# Patient Record
Sex: Female | Born: 1970 | Race: White | Hispanic: No | Marital: Married | Smoking: Former smoker
Health system: Southern US, Community
[De-identification: ages and names within clinical notes are randomized; demographics above are authoritative.]

## PROBLEM LIST (undated history)

## (undated) DIAGNOSIS — L309 Dermatitis, unspecified: Secondary | ICD-10-CM

## (undated) HISTORY — DX: Dermatitis, unspecified: L30.9

---

## 2007-02-20 ENCOUNTER — Encounter: Admission: RE | Admit: 2007-02-20 | Discharge: 2007-02-20 | Payer: Self-pay | Admitting: Family Medicine

## 2008-03-24 ENCOUNTER — Encounter: Admission: RE | Admit: 2008-03-24 | Discharge: 2008-03-24 | Payer: Self-pay | Admitting: Family Medicine

## 2013-10-21 ENCOUNTER — Other Ambulatory Visit: Payer: Self-pay | Admitting: Family Medicine

## 2013-10-21 DIAGNOSIS — Z1231 Encounter for screening mammogram for malignant neoplasm of breast: Secondary | ICD-10-CM

## 2013-11-10 ENCOUNTER — Ambulatory Visit: Payer: Self-pay

## 2015-12-19 ENCOUNTER — Ambulatory Visit (INDEPENDENT_AMBULATORY_CARE_PROVIDER_SITE_OTHER): Payer: BLUE CROSS/BLUE SHIELD | Admitting: Pediatrics

## 2015-12-19 ENCOUNTER — Encounter: Payer: Self-pay | Admitting: Pediatrics

## 2015-12-19 VITALS — BP 104/68 | HR 75 | Temp 98.1°F | Resp 20 | Ht 65.35 in | Wt 135.0 lb

## 2015-12-19 DIAGNOSIS — L2089 Other atopic dermatitis: Secondary | ICD-10-CM

## 2015-12-19 DIAGNOSIS — J301 Allergic rhinitis due to pollen: Secondary | ICD-10-CM | POA: Diagnosis not present

## 2015-12-19 DIAGNOSIS — T781XXA Other adverse food reactions, not elsewhere classified, initial encounter: Secondary | ICD-10-CM | POA: Diagnosis not present

## 2015-12-19 DIAGNOSIS — T7800XA Anaphylactic reaction due to unspecified food, initial encounter: Secondary | ICD-10-CM

## 2015-12-19 MED ORDER — DESONIDE 0.05 % EX OINT: TOPICAL_OINTMENT | CUTANEOUS | 3 refills | Status: AC

## 2015-12-19 MED ORDER — FLUTICASONE PROPIONATE 50 MCG/ACT NA SUSP: 2.0000 | Freq: Every day | NASAL | 5 refills | Status: DC

## 2015-12-19 NOTE — Progress Notes (Signed)
[address] [place] [address] Dept: [phone]  New Patient Note  Patient ID: Sherry Eaton, female    DOB: [birth year]  Age: 45 y.o. MRN: [id number] Date of Office Visit: 12/19/2015 Referring provider: Richmond Campbell, PA-C [address], [place] [address]    Chief Complaint: New Patient (Initial Visit) (pt started with dryness and redness in face on sept 5th. Pt has done a round of antibiotic eye drops and a round of prednisone)  HPI Sherry Eaton presents for evaluation of an itchy rash on her face which began in early August. The rash  improved with the use of prednisone. The rash got worse with the use of Eucrisa .Marland Kitchen She has used desonide cream in the past  On September 5 she had 2 steroid injections and the rash improved for 48 hours. She has had a similar rash during the months of August to October for the past 8 years. She has had seasonal allergic rhinitis for several years . She has aggravation of her symptoms on exposure to dust, cat, dog and  the spring and fall of the year. She also has had eczema.  She has had anaphylactic reactions to peanut, tree nuts, egg, shellfish and mushrooms. She has had itching of her mouth from peaches, apples, plums, cherries and nectarines.. Based on some skin testing done in the past she has been avoiding soy, chicken, garlic, sesame, pork and celery.Marland Kitchen She was  then allergic to all pollens. Pork gives her abdominal pain only.  Review of Systems  Constitutional: Negative.   HENT:       Allergic rhinitis for several years  Eyes:       Itchy eyes at times  Respiratory:       Asthma in childhood but no longer a problem  Cardiovascular: Negative.   Gastrointestinal: Negative.   Genitourinary: Negative.   Musculoskeletal: Negative.   Skin:       History of eczema. Rash on her face from August until October for the past 8 years  Neurological: Negative.   Endo/Heme/Allergies:       No diabetes or thyroid disease. Allergic  reactions to foods  Psychiatric/Behavioral: Negative.     Outpatient Encounter Prescriptions as of 12/19/2015  Medication Sig  . cetirizine (ZYRTEC) 10 MG tablet Take 10 mg by mouth daily.  Marland Kitchen desonide (DESOWEN) 0.05 % ointment Apply twice daily as needed to red itchy areas  . diphenhydrAMINE (BENADRYL) 25 MG tablet Take 25 mg by mouth every 6 (six) hours as needed.  Marland Kitchen EPINEPHrine 0.3 mg/0.3 mL IJ SOAJ injection Inject 0.3 mg into the muscle once.  . [DISCONTINUED] desonide (DESOWEN) 0.05 % ointment Apply 1 application topically 2 (two) times daily.  . fluticasone (FLONASE) 50 MCG/ACT nasal spray Place 2 sprays into both nostrils daily.   No facility-administered encounter medications on file as of 12/19/2015.      Drug Allergies:  Allergies  Allergen Reactions  . Aspirin Other (See Comments)    other  . Iodine   . Penicillin G Other (See Comments)    other  . Shellfish Allergy Other (See Comments)    other    Family History: Primrose's family history includes Allergic rhinitis in her father and mother..Asthma in a nephew. Food allergies in her mother. There is no family history of eczema, hives, chronic bronchitis or emphysema  Social and environmental. There are no pets in the home. She is not exposed to cigarette smoking. She has never smoked cigarettes. She  works indoors. Her symptoms are not worse at work.  Physical Exam: BP 104/68   Pulse 75   Temp 98.1 F (36.7 C) (Oral)   Resp 20   Ht 5' 5.35" (1.66 m)   Wt 135 lb (61.2 kg)   SpO2 100%   BMI 22.22 kg/m    Physical Exam  Constitutional: She is oriented to person, place, and time. She appears well-developed and well-nourished.  HENT:  Eyes normal. Ears normal. Nose moderate swelling of nasal turbinates. Pharynx normal.  Neck: Neck supple. No thyromegaly present.  Cardiovascular:  S1 and S2 normal no murmurs  Pulmonary/Chest:  Clear to percussion and auscultation  Abdominal: Soft. There is no tenderness (no  hepatosplenomegaly).  Lymphadenopathy:    She has no cervical adenopathy.  Neurological: She is alert and oriented to person, place, and time.  Skin:  Erythematous papulosquamous eruption involving her face. Mainly forehead and upper cheeks. There was periorbital involvement also. She had eczematoid changes on her thorax and the back of her legs  Psychiatric: She has a normal mood and affect. Her behavior is normal. Judgment and thought content normal.  Vitals reviewed.   Diagnostics:  Allergy skin tests were extremely positive to grass pollens, weeds, tree pollens, dust mite, cat, dog, cockroach. Slight reactivity noted to molds. She had very positive skin test to peanut, egg, shellfish, cashew, shrimp, crab, walnut. There was mild reactivity to pork. Skin testing to soy was insignificant, skin testing to celery was negative, skin testing to sesame , garlic and chicken were negative  Assessment  Assessment and Plan: 1. Anaphylactic shock due to food, initial encounter   2. Acute seasonal allergic rhinitis due to pollen   3. Flexural atopic dermatitis   4. Pollen-food allergy, initial encounter     Meds ordered this encounter  Medications  . fluticasone (FLONASE) 50 MCG/ACT nasal spray    Sig: Place 2 sprays into both nostrils daily.    Dispense:  16 g    Refill:  5  . desonide (DESOWEN) 0.05 % ointment    Sig: Apply twice daily as needed to red itchy areas    Dispense:  30 g    Refill:  3    Patient Instructions  Environmental control of dust mite Zyrtec 10 mg once a day for runny nose or itchy eyes or itching Fluticasone 2 sprays per nostril once a day for stuffy nose Opcon-A one drop 3 times a day if needed for itchy eyes Add prednisone 20 mg twice a day for 3 days, 20 mg on the fourth day, 10 mg on the fifth day Desonide 0.05% ointment twice a day to red itchy areas. We will keep in mind the possibility of a photosensitive dermatitis  Avoid peanuts, tree nuts, egg,  mushrooms, shellfish. If you have an allergic reaction take Benadryl 4 teaspoonfuls every 4 hours. If you have life-threatening symptoms inject with EpiPen 0.3 mg. Be careful with pork.  I gave you a list of foods associated with the oral allergy syndrome. You  have had difficulties from peach, apple, plums, cherries, nectarines. If you have itching of your mouth see which foods you ate  before the itching began  You may add soy and chicken  into the diet .    Return in about 4 weeks (around 01/16/2016).   Thank you for the opportunity to care for this patient.  Please do not hesitate to contact me with questions.  Tonette BihariJ. A. Tarvaris Puglia, M.D.  [facility] [address] [place], [place] [address] [phone]

## 2015-12-19 NOTE — Patient Instructions (Addendum)
Environmental control of dust mite Zyrtec 10 mg once a day for runny nose or itchy eyes or itching Fluticasone 2 sprays per nostril once a day for stuffy nose Opcon-A one drop 3 times a day if needed for itchy eyes Add prednisone 20 mg twice a day for 3 days, 20 mg on the fourth day, 10 mg on the fifth day Desonide 0.05% ointment twice a day to red itchy areas. We will keep in mind the possibility of a photosensitive dermatitis  Avoid peanuts, tree nuts, egg, mushrooms, shellfish. If you have an allergic reaction take Benadryl 4 teaspoonfuls every 4 hours. If you have life-threatening symptoms inject with EpiPen 0.3 mg. Be careful with pork.  I gave you a list of foods associated with the oral allergy syndrome. You  have had difficulties from peach, apple, plums, cherries, nectarines. If you have itching of your mouth see which foods you ate  before the itching began  You may add soy and chicken  into the diet .

## 2015-12-20 DIAGNOSIS — L2089 Other atopic dermatitis: Secondary | ICD-10-CM | POA: Insufficient documentation

## 2015-12-20 DIAGNOSIS — T781XXA Other adverse food reactions, not elsewhere classified, initial encounter: Secondary | ICD-10-CM | POA: Insufficient documentation

## 2015-12-20 DIAGNOSIS — T7800XA Anaphylactic reaction due to unspecified food, initial encounter: Secondary | ICD-10-CM | POA: Insufficient documentation

## 2015-12-20 DIAGNOSIS — J301 Allergic rhinitis due to pollen: Secondary | ICD-10-CM | POA: Insufficient documentation

## 2015-12-26 ENCOUNTER — Telehealth: Payer: Self-pay | Admitting: Allergy

## 2015-12-26 NOTE — Telephone Encounter (Signed)
Please advise 

## 2015-12-26 NOTE — Telephone Encounter (Signed)
Dr. Nunzio CobbsBobbitt, am sorry I thought it was your patient. Will give to Dr. Beaulah DinningBardelas.

## 2015-12-26 NOTE — Telephone Encounter (Signed)
No problem.

## 2015-12-26 NOTE — Telephone Encounter (Signed)
Patient called said she was seen on 12-19-15. Said eyes were getting red, itchy and swollen. Said she didn't think the eye drops were helping. She is  Still using the opcon-A eye drops, fluticasone,and desonide. She said she would be interested in starting allergy injections if you thought it would help.Please Advise

## 2015-12-26 NOTE — Telephone Encounter (Signed)
It appears from her testing that she would be a good candidate for immunotherapy, but she saw Dr. Beaulah DinningBardelas on 12/19/15 and I have never seen her. Please check with him about starting injections and he would place the orders.

## 2015-12-26 NOTE — Telephone Encounter (Signed)
Please call patient. Tell her that I would like to see how her face is doing in 3 weeks before making a decision regarding allergy injections

## 2015-12-26 NOTE — Telephone Encounter (Signed)
Informed patient to follow up with Dr. Beaulah DinningBardelas in three weeks. If it gets really bad call office sooner.

## 2016-01-01 ENCOUNTER — Ambulatory Visit: Payer: Self-pay | Admitting: Allergy and Immunology

## 2016-01-23 ENCOUNTER — Encounter: Payer: Self-pay | Admitting: Pediatrics

## 2016-01-23 ENCOUNTER — Ambulatory Visit (INDEPENDENT_AMBULATORY_CARE_PROVIDER_SITE_OTHER): Payer: BLUE CROSS/BLUE SHIELD | Admitting: Pediatrics

## 2016-01-23 VITALS — BP 90/70 | HR 89 | Temp 98.1°F | Resp 16

## 2016-01-23 DIAGNOSIS — J301 Allergic rhinitis due to pollen: Secondary | ICD-10-CM | POA: Diagnosis not present

## 2016-01-23 DIAGNOSIS — L2089 Other atopic dermatitis: Secondary | ICD-10-CM | POA: Diagnosis not present

## 2016-01-23 DIAGNOSIS — T7800XD Anaphylactic reaction due to unspecified food, subsequent encounter: Secondary | ICD-10-CM

## 2016-01-23 DIAGNOSIS — T781XXD Other adverse food reactions, not elsewhere classified, subsequent encounter: Secondary | ICD-10-CM

## 2016-01-23 NOTE — Progress Notes (Signed)
  [address] [place] [address] Dept: [phone]  FOLLOW UP NOTE  Patient ID: Sherry Eaton, female    DOB: 08-20-1970  Age: 45 y.o. MRN: [id number] Date of Office Visit: 01/23/2016  Assessment  Chief Complaint: Allergic Rhinitis  (eczema break out on arm)  HPI Sherry Flemingsarah Ollinger presents for follow-up of severe eczema, allergic rhinitis and food allergies. With the use of prednisone for 5 days , her eczema I did improve significantly , but then 2 days later it came back. Her nasal symptoms are well controlled. She is using desonide 0.05% ointment twice a day to red itchy areas She continues to avoid peanuts, tree nuts, egg, mushroom, shellfish. She has oral allergy syndrome and  avoids fresh peaches, apples, plums, cherries and nectarines.   Current medications are Zyrtec 10 mg once a Fluticasone 2 sprays per nostril once a Month desonide 0.05% ointment twice a day to red itchy areas and Benadryl and EpiPen 0.3 mg if needed.   Drug Allergies:  Allergies  Allergen Reactions  . Aspirin Other (See Comments)    other  . Iodine   . Penicillin G Other (See Comments)    other  . Shellfish Allergy Other (See Comments)    other  . Eucrisa [Crisaborole] Rash    Physical Exam: BP 90/70   Pulse 89   Temp 98.1 F (36.7 C) (Oral)   Resp 16    Physical Exam  Constitutional: She is oriented to person, place, and time. She appears well-developed.  HENT:  Eyes normal. Ears normal. Nose mild swelling of his turbinates. Pharynx normal.  Cardiovascular:  S1 and S2 normal no murmurs  Pulmonary/Chest:  Clear to percussion and auscultation  Lymphadenopathy:    She has no cervical adenopathy.  Neurological: She is alert and oriented to person, place, and time.  Skin:  Moderate to severe eczematoid changes on her face, back of her neck  and arms  Psychiatric: She has a normal mood and affect. Her behavior is normal. Thought content normal.  Vitals reviewed.   Diagnostics:   none  Assessment and Plan: 1. Flexural atopic dermatitis   2. Anaphylactic shock due to food, subsequent encounter   3. Pollen-food allergy, subsequent encounter   4. Acute seasonal allergic rhinitis due to pollen     No orders of the defined types were placed in this encounter.   Patient Instructions  Continue on the current treatment plan She  will try Atopiclair ointment twice a day to see if she has further relief from her eczema. She may use it with desonide, but she will try it on an  area initially away from the face We will go ahead and initiate the approval process for Dupixent for her severe eczema   No Follow-up on file.    Thank you for the opportunity to care for this patient.  Please do not hesitate to contact me with questions.  Tonette BihariJ. A. Kano Heckmann, M.D.  [facility] [address] [place], [place] [address] [phone]

## 2016-01-24 ENCOUNTER — Telehealth: Payer: Self-pay

## 2016-01-24 ENCOUNTER — Other Ambulatory Visit: Payer: Self-pay

## 2016-01-24 MED ORDER — ATOPICLAIR EX CREA: TOPICAL_CREAM | CUTANEOUS | 0 refills | Status: AC

## 2016-01-24 NOTE — Telephone Encounter (Signed)
Per Dr. Beaulah DinningBardelas, call patient.  He wants to speak with patient regarding Dupixent.  Dr. Beaulah DinningBardelas spoke with patient.  Mailed information on Dupixent to patient.

## 2016-01-24 NOTE — Patient Instructions (Addendum)
Continue on the current treatment plan She  will try Atopiclair ointment twice a day to see if she has further relief from her eczema. She may use it with desonide, but she will try it on an  area initially away from the face We will go ahead and initiate the approval process for Dupixent for her severe eczema

## 2016-01-30 NOTE — Telephone Encounter (Signed)
On 01/24/16 called in Atopiclair cream to [facility] [address].  Needs PA.  PA completed.  Received notification from [employer] that Atopiclair is not a covered drug and there is no PA available.  Per Dr. Beaulah DinningBardelas, continue Desonide to eczema areas as directed.  01/30/16 Called patient and gave all information.

## 2016-03-05 ENCOUNTER — Telehealth: Payer: Self-pay

## 2016-03-05 NOTE — Telephone Encounter (Signed)
Patient spoke with Dr. Beaulah DinningBardelas on 01/24/16 about starting Dupixent.  Patient does want to start this.  She thought Dr. Beaulah DinningBardelas had already spoken with Tammy about starting process for Dupixent. Please call patient with status.

## 2016-03-05 NOTE — Telephone Encounter (Signed)
Called patient and advised to contact [facility] to set up delivery for Dupixent had already spoken to patient on 12/29 to advise rx ready and contact pharmacy after getting copay card info

## 2016-03-08 ENCOUNTER — Ambulatory Visit (INDEPENDENT_AMBULATORY_CARE_PROVIDER_SITE_OTHER): Payer: 59 | Admitting: *Deleted

## 2016-03-08 DIAGNOSIS — L209 Atopic dermatitis, unspecified: Secondary | ICD-10-CM

## 2016-03-08 NOTE — Progress Notes (Signed)
Immunotherapy   Patient Details  Name: Sherry Eaton Wirz MRN: [id number] Date of Birth: 07-Sep-1970  03/08/2016  Sherry Eaton Motz here to start Dupixent. Rosana Fretarrie Whitaker, CMA administered 300 mg in right outer thigh. Patient administered 300 mg in left outer thigh.  Frequency:Q 14 days  Epi-Pen:Epi-Pen Available  Consent signed and patient instructions given. Patient waited 15 minutes with no problems.  Lot number: [id number]   Expires: 04/2017 NDC: [id number]   Maurine SimmeringLogan D Keawe Marcello 03/08/2016, 11:57 AM

## 2016-05-08 ENCOUNTER — Other Ambulatory Visit: Payer: Self-pay | Admitting: Allergy

## 2016-05-10 ENCOUNTER — Telehealth: Payer: Self-pay | Admitting: *Deleted

## 2016-05-10 NOTE — Telephone Encounter (Signed)
Patient new insurance denied her Dupixent so I have sent note to Dr Beaulah DinningBardelas regarding next step. I have also talked to patient and advised same. If you get call again before I get a chance to advise them you can relay info

## 2016-05-10 NOTE — Telephone Encounter (Signed)
Called patient advised her that her new insurance [employer] [place] denied her Dupixent that she is currently on due to no trial of Protopic.  I had to send all her records with PA request.  I advised her I would ocnsult with Dr Beaulah DinningBardelas to see if he wants to discuss with physician reviewer, appeal or meet the criteria of using Protopic as their policy mandates. He probably will not get this message until next week since he is not working today and I advised patient of same.

## 2016-05-10 NOTE — Telephone Encounter (Signed)
Pharmacy calling regarding dupixent, it needs a PA and I was told the insurance needed to be called to get approved. She said she had also called on the 7th.

## 2016-05-13 NOTE — Telephone Encounter (Signed)
Please contact Tammy. What strength Protopic does  she need  to use, and for how long? How has she done on Dupixent?

## 2016-05-14 NOTE — Telephone Encounter (Signed)
Lm for pt to call us back  

## 2016-05-14 NOTE — Telephone Encounter (Signed)
Insurance called and said Dupixent was not covered. Waiting for patient to call back.

## 2016-05-15 MED ORDER — TACROLIMUS 0.1 % EX OINT: TOPICAL_OINTMENT | Freq: Two times a day (BID) | CUTANEOUS | 2 refills | Status: AC

## 2016-05-15 NOTE — Telephone Encounter (Signed)
Received callback from patient regarding denial for Dupixent.  Per Dr Beaulah DinningBardelas  In last week contact will have patient try Protopic .1% as required by insurance and made appt for patient to followup with Dr B in 5 weeks so we have proof of trial and therapeutic failure for ins.  She advised she did really well on Dupixent and had very few areas mainly around eyes that flared up on drug

## 2016-05-15 NOTE — Addendum Note (Signed)
Addended by: Devoria GlassingVONCANNON, Celsa Nordahl M on: 05/15/2016 02:01 PM   Modules accepted: Orders

## 2016-05-15 NOTE — Telephone Encounter (Signed)
Elidel  1% cream was prescribed . She will use twice a day. I will see her in about 4 weeks

## 2016-05-16 MED ORDER — PIMECROLIMUS 1 % EX CREA: TOPICAL_CREAM | CUTANEOUS | 5 refills | Status: DC

## 2016-05-16 NOTE — Addendum Note (Signed)
Addended byClyda Greener: Amaree Leeper M on: 05/16/2016 01:59 PM   Modules accepted: Orders

## 2016-05-16 NOTE — Telephone Encounter (Signed)
Called in Elidel 1% cream per Dr. Beaulah DinningBardelas.

## 2016-06-17 ENCOUNTER — Telehealth: Payer: Self-pay | Admitting: *Deleted

## 2016-06-17 NOTE — Telephone Encounter (Signed)
I contacted patient regarding possible free drug Dupixent through bridge program while she is having to use  Elidel as required by her new insurance.  She advised that right now the Elidel is working well and will hold off on Dupixent.  I advised her if she doesn't continue to do well and wants to go back on therapy to let me know.

## 2016-06-17 NOTE — Telephone Encounter (Signed)
Thanks. Sent to [facility]

## 2016-06-24 ENCOUNTER — Encounter: Payer: Self-pay | Admitting: Pediatrics

## 2016-06-24 ENCOUNTER — Ambulatory Visit (INDEPENDENT_AMBULATORY_CARE_PROVIDER_SITE_OTHER): Payer: BLUE CROSS/BLUE SHIELD | Admitting: Pediatrics

## 2016-06-24 VITALS — BP 96/58 | HR 72 | Temp 97.7°F | Resp 16

## 2016-06-24 DIAGNOSIS — J301 Allergic rhinitis due to pollen: Secondary | ICD-10-CM

## 2016-06-24 DIAGNOSIS — T781XXD Other adverse food reactions, not elsewhere classified, subsequent encounter: Secondary | ICD-10-CM

## 2016-06-24 DIAGNOSIS — L2089 Other atopic dermatitis: Secondary | ICD-10-CM

## 2016-06-24 NOTE — Patient Instructions (Addendum)
Continue on her current treatment plan. Tacrolimus 0.1% ointment twice a day if needed to red itchy areas Avoid peanuts, tree nuts, egg, mushroom, shellfish. If she has an allergic reaction she can take Benadryl 50 mg every 4 hours and if she has life-threatening symptoms she will inject with EpiPen 0.3 mg Call me if she is not doing well on this treatment plan

## 2016-06-24 NOTE — Progress Notes (Signed)
  [address] [place] [address] Dept: [phone]  FOLLOW UP NOTE  Patient ID: Sherry Eaton, female    DOB: 1970/10/16  Age: 46 y.o. MRN: [id number] Date of Office Visit: 06/24/2016  Assessment  Chief Complaint: Eczema  HPI Sherry Eaton presents for follow-up of eczema. She had 3 months of dupilumab and her eczema did improve. She changed insurance companies and they would not pay for. She has to fail tacrolimus 0.1% ointment twice a day for approval. Her eczema has improved with tacrolimus and she uses it once a week. At times she has been having nasal congestion. She has a history of oral allergy syndrome  Current medications are Benadryl 25 mg at night, fluticasone 2 sprays per nostril once a day if needed and Benadryl and EpiPen 0.3 mg if needed and tacrolimus 0.1% ointment twice a day if needed   Drug Allergies:  Allergies  Allergen Reactions  . Aspirin Other (See Comments)    other  . Iodine   . Penicillin G Other (See Comments)    other  . Shellfish Allergy Other (See Comments)    other  . Eucrisa [Crisaborole] Rash    Physical Exam: BP (!) 96/58   Pulse 72   Temp 97.7 F (36.5 C) (Oral)   Resp 16   SpO2 100%    Physical Exam  Constitutional: She is oriented to person, place, and time. She appears well-developed and well-nourished.  HENT:  Eyes normal. Ears normal. Nose mild swelling of nasal turbinates. Pharynx normal.  Neck: Neck supple.  Cardiovascular:  S1 and S2 normal no murmurs  Pulmonary/Chest:  Clear to percussion and auscultation  Lymphadenopathy:    She has no cervical adenopathy.  Neurological: She is alert and oriented to person, place, and time.  Skin:  Clear  Psychiatric: She has a normal mood and affect. Her behavior is normal. Judgment and thought content normal.  Vitals reviewed.   Diagnostics:   none Assessment and Plan: 1. Flexural atopic dermatitis   2. Seasonal allergic rhinitis due to pollen   3. Pollen-food  allergy, subsequent encounter       Patient Instructions  Continue on her current treatment plan. Tacrolimus 0.1% ointment twice a day if needed to red itchy areas Avoid peanuts, tree nuts, egg, mushroom, shellfish. If she has an allergic reaction she can take Benadryl 50 mg every 4 hours and if she has life-threatening symptoms she will inject with EpiPen 0.3 mg Call me if she is not doing well on this treatment plan   Return in about 6 months (around 12/24/2016).    Thank you for the opportunity to care for this patient.  Please do not hesitate to contact me with questions.  Tonette Bihari, M.D.  [facility] [address] [place], [place] [address] [phone]

## 2016-11-21 ENCOUNTER — Other Ambulatory Visit: Payer: Self-pay | Admitting: Family Medicine

## 2016-11-21 DIAGNOSIS — Z1231 Encounter for screening mammogram for malignant neoplasm of breast: Secondary | ICD-10-CM

## 2016-11-27 ENCOUNTER — Ambulatory Visit
Admission: RE | Admit: 2016-11-27 | Discharge: 2016-11-27 | Disposition: A | Discharge status: Home / Self Care | Payer: BLUE CROSS/BLUE SHIELD | Source: Ambulatory Visit | Attending: Family Medicine | Admitting: Family Medicine

## 2016-11-27 DIAGNOSIS — Z1231 Encounter for screening mammogram for malignant neoplasm of breast: Secondary | ICD-10-CM

## 2016-12-31 ENCOUNTER — Other Ambulatory Visit: Payer: Self-pay | Admitting: Allergy

## 2017-03-11 ENCOUNTER — Encounter: Payer: Self-pay | Admitting: Pediatrics

## 2017-03-11 ENCOUNTER — Ambulatory Visit (INDEPENDENT_AMBULATORY_CARE_PROVIDER_SITE_OTHER): Payer: BLUE CROSS/BLUE SHIELD | Admitting: Pediatrics

## 2017-03-11 VITALS — BP 106/74 | HR 84 | Temp 98.1°F | Resp 16 | Ht 65.5 in | Wt 133.8 lb

## 2017-03-11 DIAGNOSIS — T7800XD Anaphylactic reaction due to unspecified food, subsequent encounter: Secondary | ICD-10-CM | POA: Diagnosis not present

## 2017-03-11 DIAGNOSIS — J301 Allergic rhinitis due to pollen: Secondary | ICD-10-CM

## 2017-03-11 DIAGNOSIS — L2089 Other atopic dermatitis: Secondary | ICD-10-CM

## 2017-03-11 DIAGNOSIS — T781XXD Other adverse food reactions, not elsewhere classified, subsequent encounter: Secondary | ICD-10-CM | POA: Diagnosis not present

## 2017-03-11 MED ORDER — FLUTICASONE PROPIONATE 50 MCG/ACT NA SUSP: NASAL | 5 refills | Status: AC

## 2017-03-11 MED ORDER — TRIAMCINOLONE ACETONIDE 0.1 % EX OINT: TOPICAL_OINTMENT | CUTANEOUS | 3 refills | Status: AC

## 2017-03-11 MED ORDER — EPINEPHRINE 0.3 MG/0.3ML IJ SOAJ: INTRAMUSCULAR | 1 refills | Status: AC

## 2017-03-11 NOTE — Progress Notes (Signed)
[address] [place] [address] Dept: [phone]  FOLLOW UP NOTE  Patient ID: Sherry Eaton, female    DOB: 08-29-1970  Age: 47 y.o. MRN: [id number] Date of Office Visit: 03/11/2017  Assessment  Chief Complaint: Eczema  HPI Sherry Eaton presents for follow-up of eczema and food allergies. She did well on Dupixent , but could not afford it when  she did not have health insurance. She had an itchy rash from September until December. She feels that it was related to venlafaxine gel which contains pork. She is allergic to pork. Over the past few weeks her eczema has worsened without a clear-cut reason. She is using a new shampoo without allergens. She is not using Benadryl , fluticasone or tacrolimus 0.1% ointment  Current medications are outlined in the chart   Drug Allergies:  Allergies  Allergen Reactions  . Other Anaphylaxis    Mushrooms, cherries, plum, apple, peach, nectarines, pork  . Aspirin Other (See Comments)    other  . Eggs Or Egg-Derived Products   . Iodine   . Peanut-Containing Drug Products     All tree nuts  . Penicillin G Other (See Comments)    other  . Shellfish Allergy Other (See Comments)    other  . Eucrisa [Crisaborole] Rash  . Venlafaxine Rash    Physical Exam: BP 106/74   Pulse 84   Temp 98.1 F (36.7 C) (Oral)   Resp 16   Ht 5' 5.5" (1.664 m)   Wt 133 lb 12.8 oz (60.7 kg)   SpO2 97%   BMI 21.93 kg/m    Physical Exam  Constitutional: She is oriented to person, place, and time. She appears well-developed and well-nourished.  HENT:  Eyes normal. Ears normal. Nose mild swelling of nasal  turbinates. Pharynx normal.  Neck: Neck supple.  Cardiovascular:  S1 and S2 normal no murmurs  Pulmonary/Chest:  Clear to percussion and auscultation  Lymphadenopathy:    She has no cervical adenopathy.  Neurological: She is alert and oriented to person, place, and time.  Skin:  Generalized eczema  Psychiatric: She has a normal mood and  affect. Her behavior is normal. Judgment and thought content normal.  Vitals reviewed.   Diagnostics:    Assessment and Plan: 1. Anaphylactic shock due to food, subsequent encounter   2. Flexural atopic dermatitis   3. Pollen-food allergy, subsequent encounter   4. Seasonal allergic rhinitis due to pollen     Meds ordered this encounter  Medications  . triamcinolone ointment (KENALOG) 0.1 %    Sig: Apply twice a day if needed to red itchy areas below the face.    Dispense:  45 g    Refill:  3  . fluticasone (FLONASE) 50 MCG/ACT nasal spray    Sig: 2 sprays per nostril once a day if needed for stuffy nose.    Dispense:  16 g    Refill:  5    Please keep rx on file. Pt will call when needed.  Marland Kitchen. EPINEPHrine 0.3 mg/0.3 mL IJ SOAJ injection    Sig: Use as directed for severe allergic reaction.    Dispense:  2 Device    Refill:  1    Dispense mylan generic brand only    Patient Instructions  Benadryl 25 mg-take 1 tablet at night for itching Zyrtec 10 mg-take 1 tablet in the morning if needed for itching Tacrolimus 0.1% ointment twice a day if needed to red itchy areas on the face Triamcinolone 0.1%  ointment twice a day if needed to red itchy areas below the face Fluticasone 2 sprays per nostril once a day if needed for stuffy nose Avoid peanuts, tree nuts, egg, mushroom, shellfish, pork. If you have an allergic reaction take Benadryl 50 mg every 4 hours and if you have life-threatening symptoms inject with EpiPen 0.3 mg Stop using the new shampoo to see if it makes a difference  Call me if you're not doing well on this treatment plan   Return in about 4 weeks (around 04/08/2017).    Thank you for the opportunity to care for this patient.  Please do not hesitate to contact me with questions.  Tonette Bihari, M.D.  [facility] [address] [place], [place] [address] [phone]

## 2017-03-11 NOTE — Patient Instructions (Addendum)
Benadryl 25 mg-take 1 tablet at night for itching Zyrtec 10 mg-take 1 tablet in the morning if needed for itching Tacrolimus 0.1% ointment twice a day if needed to red itchy areas on the face Triamcinolone 0.1% ointment twice a day if needed to red itchy areas below the face Fluticasone 2 sprays per nostril once a day if needed for stuffy nose Avoid peanuts, tree nuts, egg, mushroom, shellfish, pork. If you have an allergic reaction take Benadryl 50 mg every 4 hours and if you have life-threatening symptoms inject with EpiPen 0.3 mg Stop using the new shampoo to see if it makes a difference  Call me if you're not doing well on this treatment plan

## 2017-03-25 ENCOUNTER — Telehealth: Payer: Self-pay | Admitting: Allergy

## 2017-03-25 NOTE — Telephone Encounter (Signed)
Patient called and said she was still having trouble. Said she would like to be tested when she comes back in just want to know what is going on.Call [phone]

## 2017-03-31 NOTE — Telephone Encounter (Signed)
I talked to the patient. She has new insurance. She will call Tammy to reapply for Dupixent . Will consider updating her testing to foods when  she returns . I discussed with the patient. No need to call her.

## 2017-05-10 DIAGNOSIS — B029 Zoster without complications: Secondary | ICD-10-CM | POA: Insufficient documentation

## 2017-06-25 ENCOUNTER — Telehealth: Payer: Self-pay | Admitting: *Deleted

## 2017-06-25 MED ORDER — PREDNISONE 10 MG (21) PO TBPK: ORAL_TABLET | ORAL | 0 refills | Status: DC

## 2017-06-25 NOTE — Telephone Encounter (Signed)
Pt started dupixent last week 06/17/17 and still itching and burning and has rash from hands up to shoulder, neck down to breasts and breast down to bikini area. Pt last seen in January by Dr. Beaulah DinningBardelas.  In March she had shingles and took prednisone.  She states rash is much worse and "blown up" since she saw Dr. Beaulah DinningBardelas in January. She states she is going to try acupuncture next week. [facility] and [facility] informed her it could take anywhere up to 12 weeks for Dupixent to kick in to see improvement. She would like to see if Dr. Beaulah DinningBardelas has any recommendations to help. She is using prescription hydrocortisone cream everywhere on body expect on her chest because she states the skin on her chest is thin and she is also taking benadryl.   Should she come in for office visit? She was suppose to return in 4 weeks after her 03/11/17 OV.   Please advise.

## 2017-06-25 NOTE — Telephone Encounter (Signed)
We could repeat prednisone for 5 days to try to get her through this episode . Some patients with eczema start responding to Dupixent withi 2-4 weeks

## 2017-06-25 NOTE — Telephone Encounter (Signed)
Patient informed. I will send in prednisone pack.

## 2017-10-14 ENCOUNTER — Ambulatory Visit: Payer: BLUE CROSS/BLUE SHIELD | Admitting: Pediatrics

## 2017-10-14 ENCOUNTER — Encounter: Payer: Self-pay | Admitting: Pediatrics

## 2017-10-14 VITALS — BP 98/60 | HR 84 | Temp 98.2°F | Resp 16 | Ht 65.5 in | Wt 129.0 lb

## 2017-10-14 DIAGNOSIS — T781XXD Other adverse food reactions, not elsewhere classified, subsequent encounter: Secondary | ICD-10-CM

## 2017-10-14 DIAGNOSIS — L2089 Other atopic dermatitis: Secondary | ICD-10-CM | POA: Diagnosis not present

## 2017-10-14 DIAGNOSIS — T7800XD Anaphylactic reaction due to unspecified food, subsequent encounter: Secondary | ICD-10-CM

## 2017-10-14 DIAGNOSIS — J301 Allergic rhinitis due to pollen: Secondary | ICD-10-CM | POA: Diagnosis not present

## 2017-10-14 NOTE — Progress Notes (Signed)
  [address] [place] [id number] Dept: [phone]  FOLLOW UP NOTE  Patient ID: Sherry Eaton, female    DOB: 07/29/1970  Age: 47 y.o. MRN: [id number] Date of Office Visit: 10/14/2017  Assessment  Chief Complaint: Eczema (dupixent injections. 2 inj every 4 weeks. Eczema improved.  Had food testing.  Eliminated dailry.)  HPI Sherry Eaton presents for follow-up of eczema and food allergies.  She resumed her Dupixent injections in March and feels that she has improved.  She continues to avoid eggs, peanuts, tree nuts, shellfish mushrooms, pork, garlic, chicken.  She had some IgG testing to foods and is now avoiding milk products .  She is thinks that she is better off milk however she is now on Dupixent .  She does not want to use triamcinolone or Elidel so she can see how much improvement there is on Dupixent .  She is on cetirizine 10 mg once a day   Drug Allergies:  Allergies  Allergen Reactions  . Other Anaphylaxis    Mushrooms, cherries, plum, apple, peach, nectarines, pork  . Aspirin Other (See Comments)    other  . Aspirin-Acetaminophen-Caffeine     other  . Eggs Or Egg-Derived Products   . Influenza Vaccines     Other  . Iodine   . Peanut-Containing Drug Products     All tree nuts  . Penicillin G Other (See Comments)    other other  . Shellfish Allergy Other (See Comments)    other other  . Eucrisa [Crisaborole] Rash  . Venlafaxine Rash    Physical Exam: BP 98/60 (BP Location: Left Arm, Patient Position: Sitting, Cuff Size: Normal)   Pulse 84   Temp 98.2 F (36.8 C) (Temporal)   Resp 16   Ht 5' 5.5" (1.664 m)   Wt 129 lb (58.5 kg)   SpO2 98%   BMI 21.14 kg/m    Physical Exam  Constitutional: She appears well-developed and well-nourished.  HENT:  Eyes normal.  Ears normal.  Nose normal.  Pharynx normal.  Neck: Neck supple.  Cardiovascular:  S1-S2 normal no murmurs  Pulmonary/Chest:  Clear to percussion and auscultation  Lymphadenopathy:    She has no cervical adenopathy.  Skin:  Eczematoid changes around her upper eyelids and in the right wrist  Vitals reviewed.   Diagnostics:    Assessment and Plan: 1. Flexural atopic dermatitis   2. Anaphylactic shock due to food, subsequent encounter   3. Pollen-food allergy, subsequent encounter   4. Seasonal allergic rhinitis due to pollen     No orders of the defined types were placed in this encounter.   Patient Instructions  Continue on Dupixent injections every 2 weeks Continue avoiding milk, eggs, peanut, tree nuts, shellfish, mushrooms, pork garlic, chicken In the future I feel that we could add milk into the diet to see if it makes her eczema worse If she has an allergic reactions she should take Benadryl 50 mg every 4 hours and if she has life-threatening symptoms she will inject with EpiPen 0.3 mg Cetirizine 10 mg once a day for runny nose for itching   No follow-ups on file.    Thank you for the opportunity to care for this patient.  Please do not hesitate to contact me with questions.  Tonette BihariJ. A. Ambrose Wile, M.D.  [facility] [address] [place], [place] [address] [phone]

## 2017-10-14 NOTE — Patient Instructions (Addendum)
Continue on Dupixent injections every 2 weeks Continue avoiding milk, eggs, peanut, tree nuts, shellfish, mushrooms, pork garlic, chicken In the future I feel that we could add milk into the diet to see if it makes her eczema worse If she has an allergic reactions she should take Benadryl 50 mg every 4 hours and if she has life-threatening symptoms she will inject with EpiPen 0.3 mg Cetirizine 10 mg once a day for runny nose for itching

## 2017-11-18 ENCOUNTER — Encounter: Payer: Self-pay | Admitting: Pediatrics

## 2018-04-29 ENCOUNTER — Other Ambulatory Visit: Payer: Self-pay | Admitting: Family Medicine

## 2018-04-29 DIAGNOSIS — Z1231 Encounter for screening mammogram for malignant neoplasm of breast: Secondary | ICD-10-CM

## 2018-06-01 ENCOUNTER — Ambulatory Visit: Payer: BLUE CROSS/BLUE SHIELD

## 2018-06-10 ENCOUNTER — Ambulatory Visit: Payer: BLUE CROSS/BLUE SHIELD

## 2018-07-22 ENCOUNTER — Ambulatory Visit: Payer: BLUE CROSS/BLUE SHIELD

## 2018-11-04 ENCOUNTER — Ambulatory Visit
Admission: RE | Admit: 2018-11-04 | Discharge: 2018-11-04 | Disposition: A | Discharge status: Home / Self Care | Payer: PRIVATE HEALTH INSURANCE | Source: Ambulatory Visit | Attending: Family Medicine | Admitting: Family Medicine

## 2018-11-04 ENCOUNTER — Other Ambulatory Visit: Payer: Self-pay

## 2018-11-04 DIAGNOSIS — Z1231 Encounter for screening mammogram for malignant neoplasm of breast: Secondary | ICD-10-CM

## 2019-05-20 ENCOUNTER — Ambulatory Visit: Payer: PRIVATE HEALTH INSURANCE | Attending: Internal Medicine

## 2019-05-20 DIAGNOSIS — Z23 Encounter for immunization: Secondary | ICD-10-CM

## 2019-05-20 NOTE — Progress Notes (Signed)
   Covid-19 Vaccination Clinic  Name:  Sherry Eaton    MRN: [id number] DOB: 07-16-1970  05/20/2019  Sherry Eaton was observed post Covid-19 immunization for 30 minutes based on pre-vaccination screening without incident. She was provided with Vaccine Information Sheet and instruction to access the V-Safe system.   Sherry Eaton was instructed to call 911 with any severe reactions post vaccine: Marland Kitchen Difficulty breathing  . Swelling of face and throat  . A fast heartbeat  . A bad rash all over body  . Dizziness and weakness   Immunizations Administered    Name Date Dose VIS Date Route   Pfizer COVID-19 Vaccine 05/20/2019  9:09 AM 0.3 mL 02/12/2019 Intramuscular   Manufacturer: [employer], [employer]   Lot: [id number]   NDC: [address]-1

## 2019-06-14 ENCOUNTER — Ambulatory Visit: Payer: BC Managed Care – PPO | Attending: Internal Medicine

## 2019-06-14 DIAGNOSIS — Z23 Encounter for immunization: Secondary | ICD-10-CM

## 2019-06-14 NOTE — Progress Notes (Signed)
   Covid-19 Vaccination Clinic  Name:  Brittiany Wiehe    MRN: [id number] DOB: 08-30-1970  06/14/2019  Ms. Madding was observed post Covid-19 immunization for 30 minutes based on pre-vaccination screening without incident. She was provided with Vaccine Information Sheet and instruction to access the V-Safe system.   Ms. Meints was instructed to call 911 with any severe reactions post vaccine: Marland Kitchen Difficulty breathing  . Swelling of face and throat  . A fast heartbeat  . A bad rash all over body  . Dizziness and weakness   Immunizations Administered    Name Date Dose VIS Date Route   Pfizer COVID-19 Vaccine 06/14/2019  8:46 AM 0.3 mL 02/12/2019 Intramuscular   Manufacturer: [employer], [employer]   Lot: [id number]   NDC: [address]-8

## 2019-11-23 ENCOUNTER — Other Ambulatory Visit: Payer: Self-pay | Admitting: Family Medicine

## 2019-11-23 DIAGNOSIS — Z1231 Encounter for screening mammogram for malignant neoplasm of breast: Secondary | ICD-10-CM

## 2019-12-09 ENCOUNTER — Ambulatory Visit: Payer: BC Managed Care – PPO

## 2019-12-27 ENCOUNTER — Ambulatory Visit: Payer: BC Managed Care – PPO

## 2020-01-11 ENCOUNTER — Other Ambulatory Visit: Payer: Self-pay

## 2020-01-11 ENCOUNTER — Ambulatory Visit
Admission: RE | Admit: 2020-01-11 | Discharge: 2020-01-11 | Disposition: A | Discharge status: Home / Self Care | Payer: BC Managed Care – PPO | Source: Ambulatory Visit | Attending: Family Medicine | Admitting: Family Medicine

## 2020-01-11 DIAGNOSIS — Z1231 Encounter for screening mammogram for malignant neoplasm of breast: Secondary | ICD-10-CM

## 2021-02-01 ENCOUNTER — Other Ambulatory Visit: Payer: Self-pay | Admitting: Family Medicine

## 2021-02-01 DIAGNOSIS — Z1231 Encounter for screening mammogram for malignant neoplasm of breast: Secondary | ICD-10-CM

## 2021-03-07 ENCOUNTER — Ambulatory Visit
Admission: RE | Admit: 2021-03-07 | Discharge: 2021-03-07 | Disposition: A | Discharge status: Home / Self Care | Payer: BC Managed Care – PPO | Source: Ambulatory Visit | Attending: Family Medicine | Admitting: Family Medicine

## 2021-03-07 ENCOUNTER — Other Ambulatory Visit: Payer: Self-pay

## 2021-03-07 DIAGNOSIS — Z1231 Encounter for screening mammogram for malignant neoplasm of breast: Secondary | ICD-10-CM

## 2021-03-13 ENCOUNTER — Other Ambulatory Visit: Payer: Self-pay | Admitting: Family Medicine

## 2021-03-13 DIAGNOSIS — E2839 Other primary ovarian failure: Secondary | ICD-10-CM

## 2021-03-29 ENCOUNTER — Other Ambulatory Visit: Payer: Self-pay

## 2021-03-29 ENCOUNTER — Ambulatory Visit
Admission: RE | Admit: 2021-03-29 | Discharge: 2021-03-29 | Disposition: A | Discharge status: Home / Self Care | Payer: BC Managed Care – PPO | Source: Ambulatory Visit | Attending: Family Medicine | Admitting: Family Medicine

## 2021-03-29 DIAGNOSIS — E2839 Other primary ovarian failure: Secondary | ICD-10-CM

## 2022-09-03 ENCOUNTER — Other Ambulatory Visit: Payer: Self-pay | Admitting: Family Medicine

## 2022-09-03 DIAGNOSIS — Z0000 Encounter for general adult medical examination without abnormal findings: Secondary | ICD-10-CM

## 2022-09-06 ENCOUNTER — Ambulatory Visit
Admission: RE | Admit: 2022-09-06 | Discharge: 2022-09-06 | Disposition: A | Discharge status: Home / Self Care | Payer: BC Managed Care – PPO | Source: Ambulatory Visit | Attending: Family Medicine | Admitting: Family Medicine

## 2022-09-06 DIAGNOSIS — Z0000 Encounter for general adult medical examination without abnormal findings: Secondary | ICD-10-CM

## 2023-04-04 ENCOUNTER — Emergency Department (HOSPITAL_COMMUNITY)
Admission: EM | Admit: 2023-04-04 | Discharge: 2023-04-04 | Disposition: A | Discharge status: Home / Self Care | Payer: BC Managed Care – PPO | Attending: Emergency Medicine | Admitting: Emergency Medicine

## 2023-04-04 ENCOUNTER — Other Ambulatory Visit: Payer: Self-pay

## 2023-04-04 ENCOUNTER — Emergency Department (HOSPITAL_COMMUNITY): Payer: BC Managed Care – PPO

## 2023-04-04 ENCOUNTER — Encounter (HOSPITAL_COMMUNITY): Payer: Self-pay

## 2023-04-04 DIAGNOSIS — R0789 Other chest pain: Secondary | ICD-10-CM | POA: Diagnosis not present

## 2023-04-04 DIAGNOSIS — R079 Chest pain, unspecified: Secondary | ICD-10-CM | POA: Diagnosis present

## 2023-04-04 DIAGNOSIS — Z91010 Allergy to peanuts: Secondary | ICD-10-CM | POA: Insufficient documentation

## 2023-04-04 LAB — BASIC METABOLIC PANEL
Anion gap: 8 (ref 5–15)
BUN: 8 mg/dL (ref 6–20)
CO2: 25 mmol/L (ref 22–32)
Calcium: 9.2 mg/dL (ref 8.9–10.3)
Chloride: 104 mmol/L (ref 98–111)
Creatinine, Ser: 0.74 mg/dL (ref 0.44–1.00)
GFR, Estimated: >60 mL/min (ref >60)
Glucose, Bld: 89 mg/dL (ref 70–99)
Potassium: 3.7 mmol/L (ref 3.5–5.1)
Sodium: 137 mmol/L (ref 135–145)

## 2023-04-04 LAB — TROPONIN I (HIGH SENSITIVITY): Troponin I (High Sensitivity): 2 ng/L (ref <18)

## 2023-04-04 LAB — CBC
HCT: 42.2 % (ref 36.0–46.0)
Hemoglobin: 13.7 g/dL (ref 12.0–15.0)
MCH: 29.8 pg (ref 26.0–34.0)
MCHC: 32.5 g/dL (ref 30.0–36.0)
MCV: 91.7 fL (ref 80.0–100.0)
Platelets: 301 10*3/uL (ref 150–400)
RBC: 4.6 MIL/uL (ref 3.87–5.11)
RDW: 12.1 % (ref 11.5–15.5)
WBC: 5.3 10*3/uL (ref 4.0–10.5)
nRBC: 0 % (ref 0.0–0.2)

## 2023-04-04 MED ORDER — METHOCARBAMOL 500 MG PO TABS: 500.0000 mg | ORAL_TABLET | Freq: Two times a day (BID) | ORAL | 0 refills | Status: AC

## 2023-04-04 MED ORDER — LIDOCAINE 5 % EX PTCH: 1.0000 | MEDICATED_PATCH | CUTANEOUS | 0 refills | Status: AC

## 2023-04-04 NOTE — ED Triage Notes (Signed)
Patient reports left sided chest pain since she had a fall in December. States it has been worsening since. Patient reports she fell on her left side, hitting her knee then chest. Denies SOB. Reports some nausea.

## 2023-04-04 NOTE — ED Provider Notes (Signed)
[place] EMERGENCY DEPARTMENT AT [facility] Provider Note   CSN: [id number] Arrival date & time: 04/04/23  4098     History  Chief Complaint  Patient presents with   Chest Pain    Sherry Eaton is a 53 y.o. female.  53 year old female presents with several weeks of left-sided sharp reproducible anterior wall chest pain.  Patient states that she fell about a month ago and has had pain since that time.  Situation is worse when she lifts heavy objects.  States she had trouble sleeping last night due to when she switched positions in her bed.  No associated cardiac or respiratory symptoms.  Unrelieved with home medications.       Home Medications Prior to Admission medications   Medication Sig Start Date End Date Taking? Authorizing Provider  cetirizine (ZYRTEC) 10 MG tablet Take 10 mg by mouth daily.    [provider]  Dermatological Products, Misc. (ATOPICLAIR) CREA Apply to areas of eczema twice a day. Patient not taking: Reported on 10/14/2017 01/24/16   Fletcher Anon, MD  desonide (DESOWEN) 0.05 % ointment Apply twice daily as needed to red itchy areas Patient not taking: Reported on 03/11/2017 12/19/15   Fletcher Anon, MD  diphenhydrAMINE (BENADRYL) 25 MG tablet Take 25 mg by mouth every 6 (six) hours as needed.    [provider]  DUPIXENT 300 MG/2ML SOSY Inject 300 mg as directed every 14 (fourteen) days.  10/02/17   [provider]  EPINEPHrine 0.3 mg/0.3 mL IJ SOAJ injection Use as directed for severe allergic reaction. 03/11/17   Fletcher Anon, MD  fluticasone (FLONASE) 50 MCG/ACT nasal spray 2 sprays per nostril once a day if needed for stuffy nose. Patient not taking: Reported on 10/14/2017 03/11/17   Fletcher Anon, MD  tacrolimus (PROTOPIC) 0.1 % ointment Apply topically 2 (two) times daily. Patient not taking: Reported on 10/14/2017 05/15/16   Fletcher Anon, MD  triamcinolone ointment (KENALOG) 0.1 % Apply twice a day if  needed to red itchy areas below the face. Patient not taking: Reported on 10/14/2017 03/11/17   Fletcher Anon, MD      Allergies    Other, Aspirin, Aspirin-acetaminophen-caffeine, Egg-derived products, Influenza vaccines, Iodine, Peanut-containing drug products, Penicillin g, Shellfish allergy, Eucrisa [crisaborole], and Venlafaxine    Review of Systems   Review of Systems  All other systems reviewed and are negative.   Physical Exam Updated Vital Signs BP 116/84   Pulse 85   Temp 98 F (36.7 C) (Oral)   Resp 18   Ht 1.651 m (5\' 5" )   Wt 59 kg   LMP  (LMP Unknown)   SpO2 95%   BMI 21.63 kg/m  Physical Exam Vitals and nursing note reviewed.  Constitutional:      General: She is not in acute distress.    Appearance: Normal appearance. She is well-developed. She is not toxic-appearing.  HENT:     Head: Normocephalic and atraumatic.  Eyes:     General: Lids are normal.     Conjunctiva/sclera: Conjunctivae normal.     Pupils: Pupils are equal, round, and reactive to light.  Neck:     Thyroid: No thyroid mass.     Trachea: No tracheal deviation.  Cardiovascular:     Rate and Rhythm: Normal rate and regular rhythm.     Heart sounds: Normal heart sounds. No murmur heard.    No gallop.  Pulmonary:     Effort: Pulmonary  effort is normal. No respiratory distress.     Breath sounds: Normal breath sounds. No stridor. No decreased breath sounds, wheezing, rhonchi or rales.  Chest:     Chest wall: Tenderness present.    Abdominal:     General: There is no distension.     Palpations: Abdomen is soft.     Tenderness: There is no abdominal tenderness. There is no rebound.  Musculoskeletal:        General: No tenderness. Normal range of motion.     Cervical back: Normal range of motion and neck supple.  Skin:    General: Skin is warm and dry.     Findings: No abrasion or rash.  Neurological:     Mental Status: She is alert and oriented to person, place, and time. Mental  status is at baseline.     GCS: GCS eye subscore is 4. GCS verbal subscore is 5. GCS motor subscore is 6.     Cranial Nerves: No cranial nerve deficit.     Sensory: No sensory deficit.     Motor: Motor function is intact.  Psychiatric:        Attention and Perception: Attention normal.        Speech: Speech normal.        Behavior: Behavior normal.     ED Results / Procedures / Treatments   Labs (all labs ordered are listed, but only abnormal results are displayed) Labs Reviewed  BASIC METABOLIC PANEL  CBC  TROPONIN I (HIGH SENSITIVITY)  TROPONIN I (HIGH SENSITIVITY)    EKG EKG Interpretation Date/Time:  Friday April 04 2023 07:22:53 EST Ventricular Rate:  83 PR Interval:  168 QRS Duration:  75 QT Interval:  354 QTC Calculation: 416 R Axis:   55  Text Interpretation: Sinus rhythm No old tracing to compare Confirmed by Linwood Dibbles [phone]) on 04/04/2023 7:25:52 AM  Radiology DG Chest 2 View Result Date: 04/04/2023 CLINICAL DATA:  Left-sided chest pain. EXAM: CHEST - 2 VIEW COMPARISON:  None Available. FINDINGS: The heart size and mediastinal contours are within normal limits. There is no evidence of pulmonary edema, consolidation, pneumothorax, nodule or pleural fluid. The spine demonstrates a mild rightward convex scoliosis of the thoracic spine with compensatory leftward convex curvature of the upper lumbar spine. IMPRESSION: No acute findings. Mild scoliosis of the thoracolumbar spine. Electronically Signed   By: Irish Lack M.D.   On: 04/04/2023 08:11    Procedures Procedures    Medications Ordered in ED Medications - No data to display  ED Course/ Medical Decision Making/ A&P                                 Medical Decision Making Amount and/or Complexity of Data Reviewed Labs: ordered. Radiology: ordered.   Patient's EKG shows normal sinus rhythm.  No signs of acute coronary ischemia noted.  Chest x-ray per interpretation shows no acute findings.   Patient's pain is reproducible along her left anterior chest wall.  Will prescribe Lidoderm patches as well as muscle relaxants.  Very low suspicion for ACS or PE.        Final Clinical Impression(s) / ED Diagnoses Final diagnoses:  None    Rx / DC Orders ED Discharge Orders     None         Lorre Nick, MD 04/04/23 1134

## 2023-05-12 IMAGING — MG MM DIGITAL SCREENING BILAT W/ TOMO AND CAD
6 of 12 series · 6 of 36 positions shown · non-contrast
Comparison: Previous exam(s).

CLINICAL DATA: Screening.

EXAM:
DIGITAL SCREENING BILATERAL MAMMOGRAM WITH TOMOSYNTHESIS AND CAD
TECHNIQUE: Bilateral screening digital craniocaudal and mediolateral oblique
mammograms were obtained. Bilateral screening digital breast
tomosynthesis was performed. The images were evaluated with
computer-aided detection.

[L XCCL synth-2D]
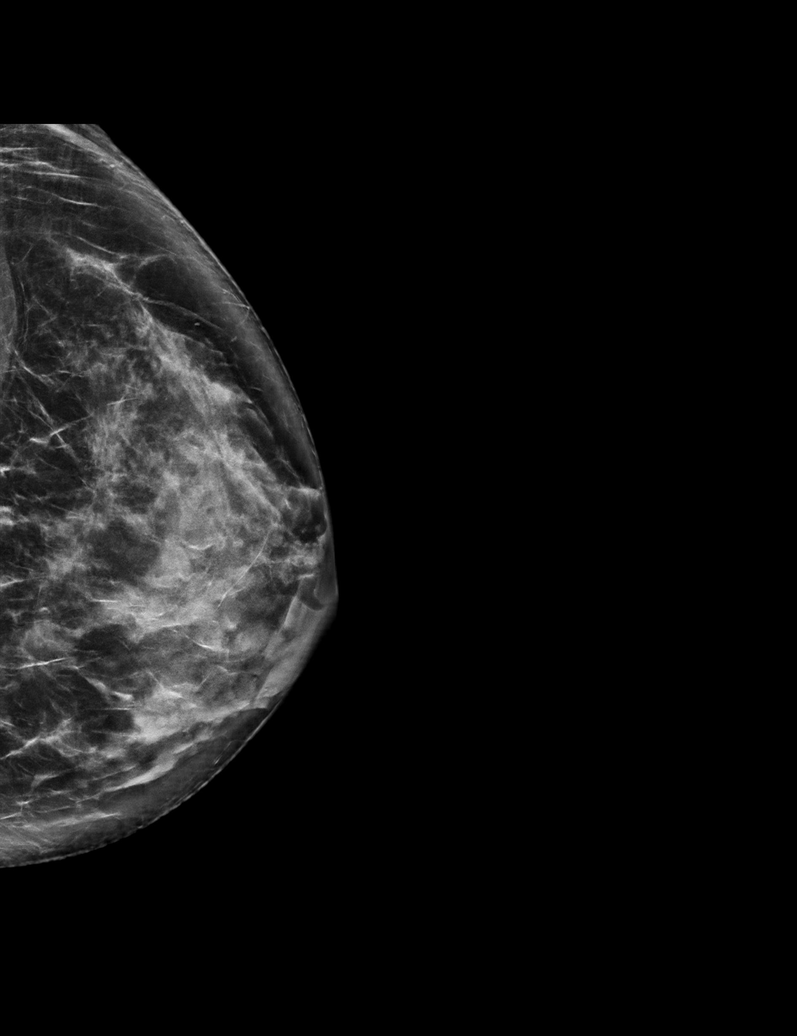

[L MLO synth-2D]
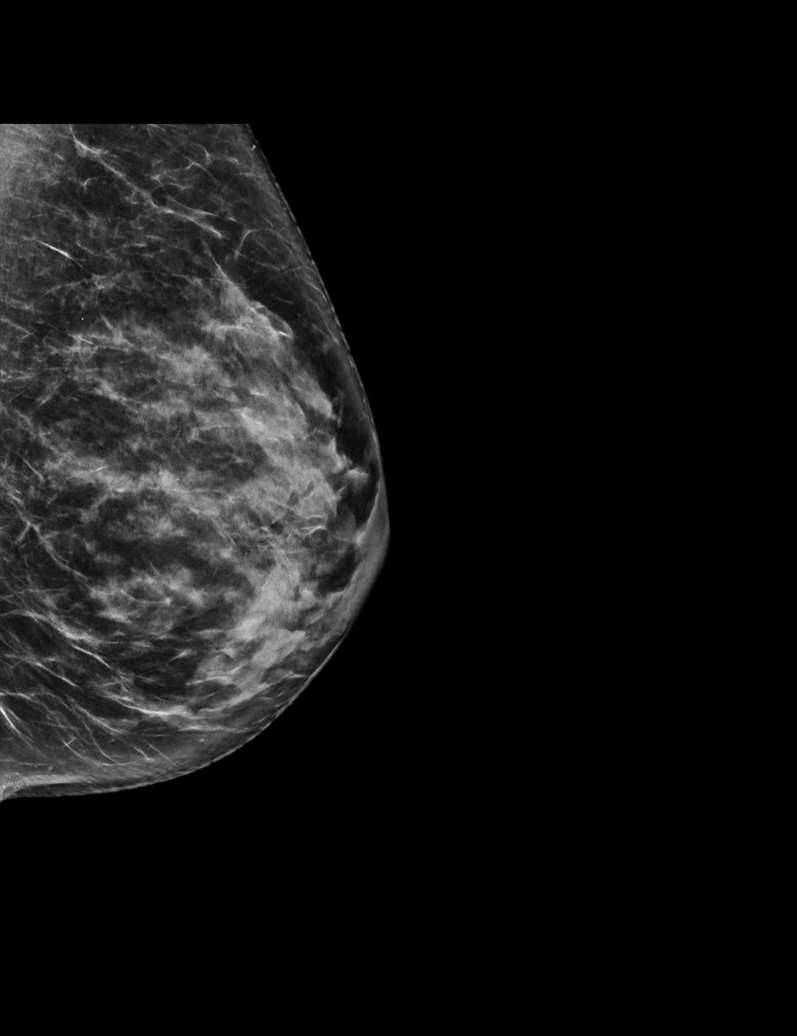

[R MLO synth-2D]
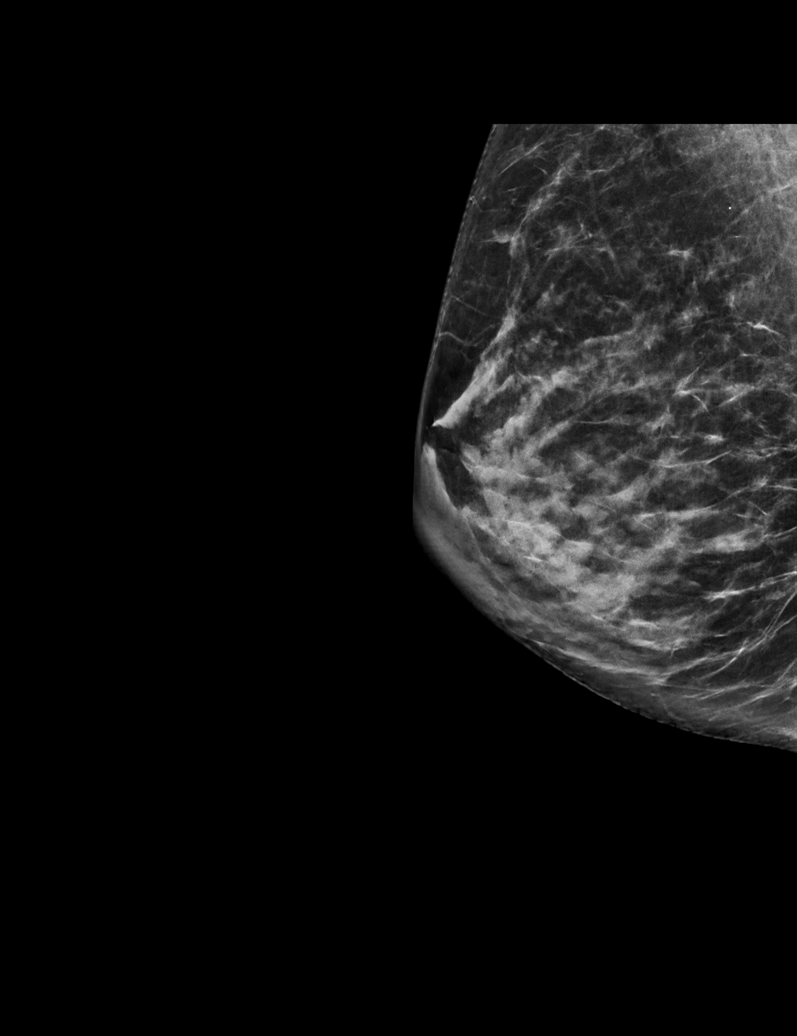

[L CC synth-2D]
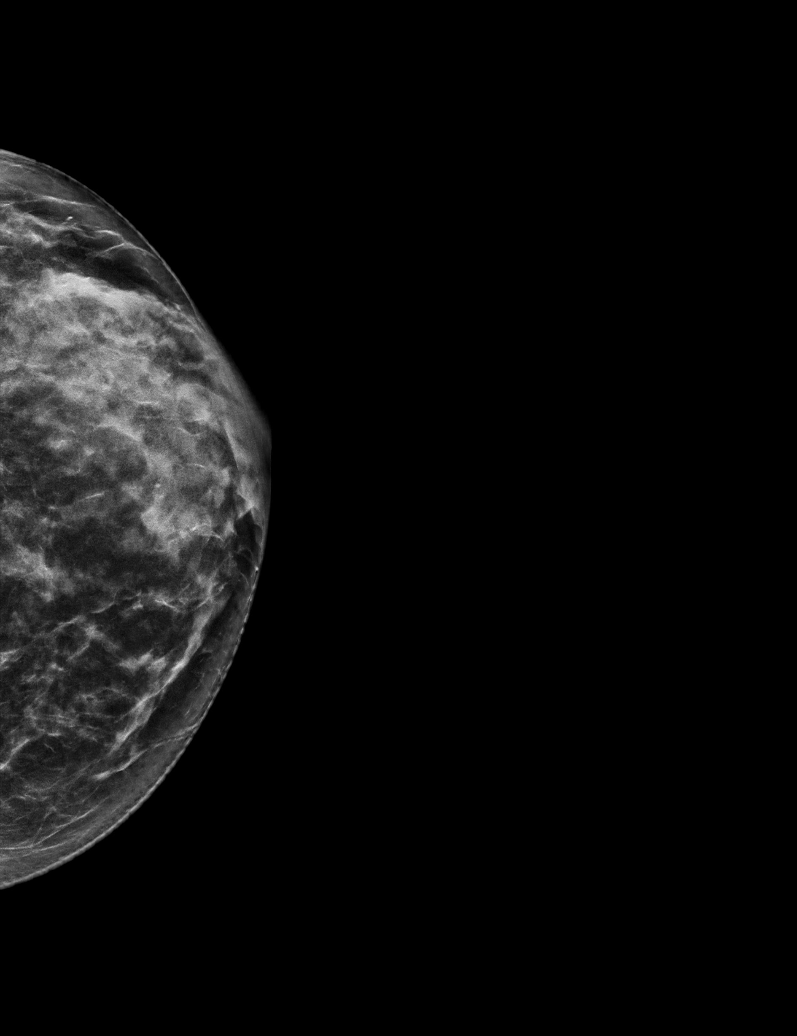

[R CC synth-2D]
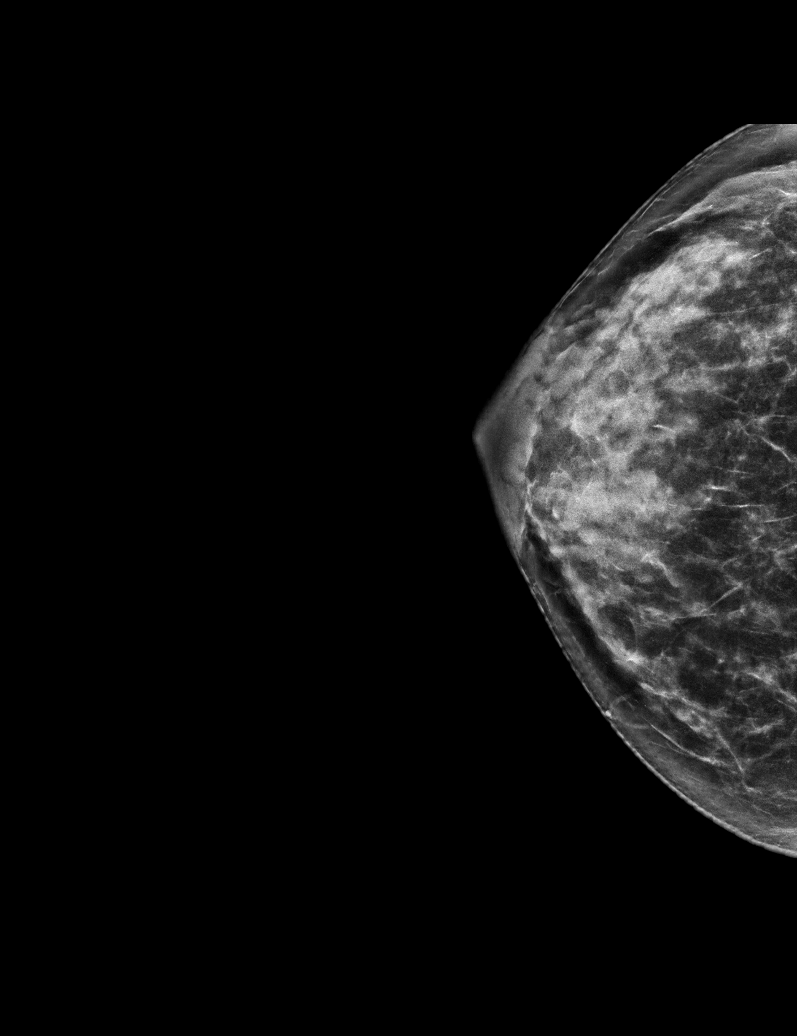

[R XCCL synth-2D]
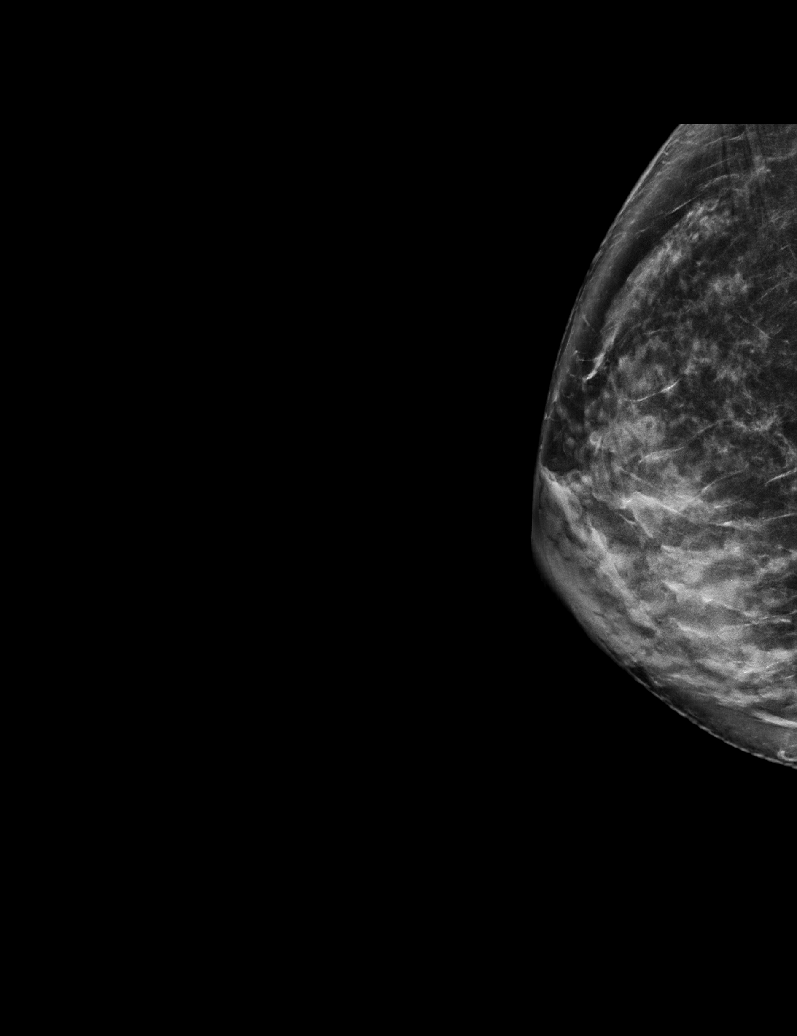

[6 of 36 positions shown; findings below may reference images not displayed]

ACR Breast Density Category c: The breast tissue is heterogeneously
dense, which may obscure small masses.
FINDINGS: There are no findings suspicious for malignancy.
IMPRESSION: No mammographic evidence of malignancy. A result letter of this
screening mammogram will be mailed directly to the patient.

RECOMMENDATION:
Screening mammogram in one year. (Code:Q3-W-BC3)

BI-RADS CATEGORY  1: Negative.

## 2023-12-08 ENCOUNTER — Other Ambulatory Visit: Payer: Self-pay | Admitting: Family Medicine

## 2023-12-08 DIAGNOSIS — Z1231 Encounter for screening mammogram for malignant neoplasm of breast: Secondary | ICD-10-CM

## 2023-12-18 ENCOUNTER — Ambulatory Visit
Admission: RE | Admit: 2023-12-18 | Discharge: 2023-12-18 | Disposition: A | Discharge status: Home / Self Care | Source: Ambulatory Visit | Attending: Family Medicine | Admitting: Family Medicine

## 2023-12-18 DIAGNOSIS — Z1231 Encounter for screening mammogram for malignant neoplasm of breast: Secondary | ICD-10-CM
